# Patient Record
Sex: Male | Born: 1990 | Race: White | Hispanic: No | Marital: Married | Smoking: Never smoker
Health system: Southern US, Community
[De-identification: ages and names within clinical notes are randomized; demographics above are authoritative.]

## PROBLEM LIST (undated history)

## (undated) DIAGNOSIS — M549 Dorsalgia, unspecified: Secondary | ICD-10-CM

## (undated) HISTORY — PX: HAND SURGERY: SHX662

---

## 2009-04-05 ENCOUNTER — Emergency Department (HOSPITAL_COMMUNITY): Admission: EM | Admit: 2009-04-05 | Discharge: 2009-04-05 | Payer: Self-pay | Admitting: Emergency Medicine

## 2009-05-14 ENCOUNTER — Emergency Department (HOSPITAL_COMMUNITY): Admission: EM | Admit: 2009-05-14 | Discharge: 2009-05-14 | Payer: Self-pay | Admitting: Family Medicine

## 2013-11-11 ENCOUNTER — Emergency Department (HOSPITAL_COMMUNITY): Payer: Worker's Compensation

## 2013-11-11 ENCOUNTER — Encounter (HOSPITAL_COMMUNITY): Payer: Self-pay | Admitting: Emergency Medicine

## 2013-11-11 ENCOUNTER — Emergency Department (HOSPITAL_COMMUNITY)
Admission: EM | Admit: 2013-11-11 | Discharge: 2013-11-11 | Disposition: A | Discharge status: Home / Self Care | Payer: Worker's Compensation | Attending: Emergency Medicine | Admitting: Emergency Medicine

## 2013-11-11 DIAGNOSIS — Y929 Unspecified place or not applicable: Secondary | ICD-10-CM | POA: Insufficient documentation

## 2013-11-11 DIAGNOSIS — W230XXA Caught, crushed, jammed, or pinched between moving objects, initial encounter: Secondary | ICD-10-CM | POA: Diagnosis not present

## 2013-11-11 DIAGNOSIS — S8990XA Unspecified injury of unspecified lower leg, initial encounter: Secondary | ICD-10-CM | POA: Insufficient documentation

## 2013-11-11 DIAGNOSIS — Y9389 Activity, other specified: Secondary | ICD-10-CM | POA: Diagnosis not present

## 2013-11-11 DIAGNOSIS — S99929A Unspecified injury of unspecified foot, initial encounter: Principal | ICD-10-CM

## 2013-11-11 DIAGNOSIS — S99919A Unspecified injury of unspecified ankle, initial encounter: Secondary | ICD-10-CM | POA: Diagnosis present

## 2013-11-11 DIAGNOSIS — S8992XA Unspecified injury of left lower leg, initial encounter: Secondary | ICD-10-CM

## 2013-11-11 MED ORDER — OXYCODONE-ACETAMINOPHEN 5-325 MG PO TABS
2.0000 | ORAL_TABLET | Freq: Once | ORAL | Status: AC
  Administered 2013-11-11: 2 via ORAL
  Filled 2013-11-11: qty 2

## 2013-11-11 MED ORDER — OXYCODONE-ACETAMINOPHEN 5-325 MG PO TABS
1.0000 | ORAL_TABLET | ORAL | Status: DC | PRN
Start: 2013-11-11 — End: 2014-02-18

## 2013-11-11 MED ORDER — HYDROCODONE-ACETAMINOPHEN 5-325 MG PO TABS
2.0000 | ORAL_TABLET | Freq: Once | ORAL | Status: AC
  Administered 2013-11-11: 2 via ORAL
  Filled 2013-11-11: qty 2
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
## 2013-11-11 NOTE — ED Provider Notes (Signed)
Medical screening examination/treatment/procedure(s) were performed by non-physician practitioner and as supervising physician I was immediately available for consultation/collaboration.  Iliani Vejar T Zaylie Gisler, MD 11/11/13 2331 

## 2013-11-11 NOTE — Progress Notes (Signed)
Orthopedic Tech Progress Note Patient Details:  Yee Joss [facility] 1990/04/21 [id number] Applied Velcro knee immobilizer to LLE.  Pulses, sensation, motion intact before and after application.  Capillary refill less than 2 seconds before and after application.  Fit crutches and taught pt. use of same. Ortho Devices Type of Ortho Device: Knee Immobilizer Ortho Device/Splint Location: LLE Ortho Device/Splint Interventions: Application   Lesle Chris 11/11/2013, 7:23 PM

## 2013-11-11 NOTE — Discharge Instructions (Signed)
Take Percocet as needed for pain. Wear knee immobilizer as directed until orthopedic follow up. Use crutches as needed for support. Follow up with Dr. Valentina Gu for further evaluation.
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
## 2014-02-18 ENCOUNTER — Emergency Department (HOSPITAL_COMMUNITY)
Admission: EM | Admit: 2014-02-18 | Discharge: 2014-02-18 | Disposition: A | Discharge status: Home / Self Care | Payer: BC Managed Care – PPO | Attending: Emergency Medicine | Admitting: Emergency Medicine

## 2014-02-18 ENCOUNTER — Encounter (HOSPITAL_COMMUNITY): Payer: Self-pay | Admitting: Emergency Medicine

## 2014-02-18 DIAGNOSIS — M5442 Lumbago with sciatica, left side: Secondary | ICD-10-CM | POA: Insufficient documentation

## 2014-02-18 DIAGNOSIS — M545 Low back pain: Secondary | ICD-10-CM | POA: Diagnosis present

## 2014-02-18 DIAGNOSIS — R109 Unspecified abdominal pain: Secondary | ICD-10-CM | POA: Insufficient documentation

## 2014-02-18 HISTORY — DX: Dorsalgia, unspecified: M54.9

## 2014-02-18 MED ORDER — ONDANSETRON 4 MG PO TBDP
4.0000 mg | ORAL_TABLET | Freq: Once | ORAL | Status: AC
  Administered 2014-02-18: 4 mg via ORAL
  Filled 2014-02-18: qty 1

## 2014-02-18 MED ORDER — CYCLOBENZAPRINE HCL 10 MG PO TABS: 10.0000 mg | ORAL_TABLET | Freq: Two times a day (BID) | ORAL | Status: AC | PRN

## 2014-02-18 MED ORDER — IBUPROFEN 800 MG PO TABS
800.0000 mg | ORAL_TABLET | Freq: Once | ORAL | Status: AC
  Administered 2014-02-18: 800 mg via ORAL
  Filled 2014-02-18: qty 1

## 2014-02-18 MED ORDER — PREDNISONE 10 MG PO TABS: 20.0000 mg | ORAL_TABLET | Freq: Every day | ORAL | Status: AC

## 2014-02-18 MED ORDER — OXYCODONE-ACETAMINOPHEN 5-325 MG PO TABS: 1.0000 | ORAL_TABLET | Freq: Four times a day (QID) | ORAL | Status: AC | PRN

## 2014-02-18 MED ORDER — DIAZEPAM 5 MG PO TABS
5.0000 mg | ORAL_TABLET | Freq: Once | ORAL | Status: AC
  Administered 2014-02-18: 5 mg via ORAL
  Filled 2014-02-18: qty 1

## 2014-02-18 MED ORDER — OXYCODONE-ACETAMINOPHEN 5-325 MG PO TABS
1.0000 | ORAL_TABLET | Freq: Once | ORAL | Status: AC
  Administered 2014-02-18: 1 via ORAL
  Filled 2014-02-18: qty 1

## 2014-02-18 NOTE — Discharge Instructions (Signed)
Lumbosacral Strain °Lumbosacral strain is a strain of any of the parts that make up your lumbosacral vertebrae. Your lumbosacral vertebrae are the bones that make up the lower third of your backbone. Your lumbosacral vertebrae are held together by muscles and tough, fibrous tissue (ligaments).  °CAUSES  °A sudden blow to your back can cause lumbosacral strain. Also, anything that causes an excessive stretch of the muscles in the low back can cause this strain. This is typically seen when people exert themselves strenuously, fall, lift heavy objects, bend, or crouch repeatedly. °RISK FACTORS °· Physically demanding work. °· Participation in pushing or pulling sports or sports that require a sudden twist of the back (tennis, golf, baseball). °· Weight lifting. °· Excessive lower back curvature. °· Forward-tilted pelvis. °· Weak back or abdominal muscles or both. °· Tight hamstrings. °SIGNS AND SYMPTOMS  °Lumbosacral strain may cause pain in the area of your injury or pain that moves (radiates) down your leg.  °DIAGNOSIS °Your health care provider can often diagnose lumbosacral strain through a physical exam. In some cases, you may need tests such as X-ray exams.  °TREATMENT  °Treatment for your lower back injury depends on many factors that your clinician will have to evaluate. However, most treatment will include the use of anti-inflammatory medicines. °HOME CARE INSTRUCTIONS  °· Avoid hard physical activities (tennis, racquetball, waterskiing) if you are not in proper physical condition for it. This may aggravate or create problems. °· If you have a back problem, avoid sports requiring sudden body movements. Swimming and walking are generally safer activities. °· Maintain good posture. °· Maintain a healthy weight. °· For acute conditions, you may put ice on the injured area. °· Put ice in a plastic bag. °· Place a towel between your skin and the bag. °· Leave the ice on for 20 minutes, 2-3 times a day. °· When the  low back starts healing, stretching and strengthening exercises may be recommended. °SEEK MEDICAL CARE IF: °· Your back pain is getting worse. °· You experience severe back pain not relieved with medicines. °SEEK IMMEDIATE MEDICAL CARE IF:  °· You have numbness, tingling, weakness, or problems with the use of your arms or legs. °· There is a change in bowel or bladder control. °· You have increasing pain in any area of the body, including your belly (abdomen). °· You notice shortness of breath, dizziness, or feel faint. °· You feel sick to your stomach (nauseous), are throwing up (vomiting), or become sweaty. °· You notice discoloration of your toes or legs, or your feet get very cold. °MAKE SURE YOU:  °· Understand these instructions. °· Will watch your condition. °· Will get help right away if you are not doing well or get worse. °Document Released: 11/20/2004 Document Revised: 02/15/2013 Document Reviewed: 09/29/2012 °ExitCare® Patient Information ©2015 [employer]. This information is not intended to replace advice given to you by your health care provider. Make sure you discuss any questions you have with your health care provider. ° °Sciatica °Sciatica is pain, weakness, numbness, or tingling along the path of the sciatic nerve. The nerve starts in the lower back and runs down the back of each leg. The nerve controls the muscles in the lower leg and in the back of the knee, while also providing sensation to the back of the thigh, lower leg, and the sole of your foot. Sciatica is a symptom of another medical condition. For instance, nerve damage or certain conditions, such as a herniated disk or   bone spur on the spine, pinch or put pressure on the sciatic nerve. This causes the pain, weakness, or other sensations normally associated with sciatica. Generally, sciatica only affects one side of the body. °CAUSES  °· Herniated or slipped disc. °· Degenerative disk disease. °· A pain disorder involving the narrow  muscle in the buttocks (piriformis syndrome). °· Pelvic injury or fracture. °· Pregnancy. °· Tumor (rare). °SYMPTOMS  °Symptoms can vary from mild to very severe. The symptoms usually travel from the low back to the buttocks and down the back of the leg. Symptoms can include: °· Mild tingling or dull aches in the lower back, leg, or hip. °· Numbness in the back of the calf or sole of the foot. °· Burning sensations in the lower back, leg, or hip. °· Sharp pains in the lower back, leg, or hip. °· Leg weakness. °· Severe back pain inhibiting movement. °These symptoms may get worse with coughing, sneezing, laughing, or prolonged sitting or standing. Also, being overweight may worsen symptoms. °DIAGNOSIS  °Your caregiver will perform a physical exam to look for common symptoms of sciatica. He or she may ask you to do certain movements or activities that would trigger sciatic nerve pain. Other tests may be performed to find the cause of the sciatica. These may include: °· Blood tests. °· X-rays. °· Imaging tests, such as an MRI or CT scan. °TREATMENT  °Treatment is directed at the cause of the sciatic pain. Sometimes, treatment is not necessary and the pain and discomfort goes away on its own. If treatment is needed, your caregiver may suggest: °· Over-the-counter medicines to relieve pain. °· Prescription medicines, such as anti-inflammatory medicine, muscle relaxants, or narcotics. °· Applying heat or ice to the painful area. °· Steroid injections to lessen pain, irritation, and inflammation around the nerve. °· Reducing activity during periods of pain. °· Exercising and stretching to strengthen your abdomen and improve flexibility of your spine. Your caregiver may suggest losing weight if the extra weight makes the back pain worse. °· Physical therapy. °· Surgery to eliminate what is pressing or pinching the nerve, such as a bone spur or part of a herniated disk. °HOME CARE INSTRUCTIONS  °· Only take over-the-counter  or prescription medicines for pain or discomfort as directed by your caregiver. °· Apply ice to the affected area for 20 minutes, 3-4 times a day for the first 48-72 hours. Then try heat in the same way. °· Exercise, stretch, or perform your usual activities if these do not aggravate your pain. °· Attend physical therapy sessions as directed by your caregiver. °· Keep all follow-up appointments as directed by your caregiver. °· Do not wear high heels or shoes that do not provide proper support. °· Check your mattress to see if it is too soft. A firm mattress may lessen your pain and discomfort. °SEEK IMMEDIATE MEDICAL CARE IF:  °· You lose control of your bowel or bladder (incontinence). °· You have increasing weakness in the lower back, pelvis, buttocks, or legs. °· You have redness or swelling of your back. °· You have a burning sensation when you urinate. °· You have pain that gets worse when you lie down or awakens you at night. °· Your pain is worse than you have experienced in the past. °· Your pain is lasting longer than 4 weeks. °· You are suddenly losing weight without reason. °MAKE SURE YOU: °· Understand these instructions. °· Will watch your condition. °· Will get help right away if   you are not doing well or get worse. °Document Released: 02/04/2001 Document Revised: 08/12/2011 Document Reviewed: 06/22/2011 °ExitCare® Patient Information ©2015 [employer]. This information is not intended to replace advice given to you by your health care provider. Make sure you discuss any questions you have with your health care provider. ° °

## 2014-02-18 NOTE — ED Notes (Deleted)
Pt tripped and fell striking l/upper face on sidewalk 8 days ago.. 6 sutures reported. Suture line well approximated

## 2014-02-18 NOTE — ED Provider Notes (Signed)
CSN: [id number]     Arrival date & time 02/18/14  1034 History  This chart was scribed for non-physician practitioner Carlos Peliffany Starasia Sinko, PA-C working with Lyanne CoKevin M Campos, MD by Conchita ParisNadim Abuhashem, ED Scribe. This patient was seen in WTR8/WTR8 and the patient's care was started at 12:18 PM.   Chief Complaint  Patient presents with  . Back Pain    low back pain radiating down l./leg  . Leg Pain   Patient is a 23 y.o. male presenting with back pain and leg pain. The history is provided by the patient. No language interpreter was used.  Back Pain Associated symptoms: abdominal pain, leg pain and numbness   Leg Pain Associated symptoms: back pain     HPI Comments: Carlos Becker is a 23 y.o. male who presents to the Emergency Department complaining of lower back pain, onset 2 days ago. The pain has gradually worsened since onset and is primarily localized on the left side of his back. He has been having re-occuring back pain for the last two years. He has numbness, tingling and shooting pain into the leg and gait problem as associated symptoms. He says he drags is left leg. Pt has tried ibuprofen for no relief. Movement worsens the pain. He denies trauma. Pt also denies urine/bowel incontinence and trouble with BM. He does have a Hx of back pain, notes having a pinched nerve.  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 Back pain    Past Surgical History  Procedure Laterality Date  . Hand surgery      r/thumb   Family History  Problem Relation Age of Onset  . Diabetes Other   . Hypertension Other   . Cancer Other    History  Substance Use Topics  . Smoking status: Never Smoker   . Smokeless tobacco: Not on file  . Alcohol Use: Yes    Review of Systems  Gastrointestinal: Positive for abdominal pain.  Musculoskeletal: Positive for back pain and gait problem.  Neurological: Positive for numbness.  All other systems  reviewed and are negative.  Allergies  Review of patient's allergies indicates no known allergies.  Home Medications   Prior to Admission medications   Medication Sig Start Date End Date Taking? Authorizing Provider  acetaminophen (TYLENOL) 500 MG tablet Take 500 mg by mouth every 6 (six) hours as needed for moderate pain.   Yes Historical Provider, MD  ibuprofen (ADVIL,MOTRIN) 200 MG tablet Take 800 mg by mouth every 6 (six) hours as needed for moderate pain.   Yes Historical Provider, MD  cyclobenzaprine (FLEXERIL) 10 MG tablet Take 1 tablet (10 mg total) by mouth 2 (two) times daily as needed for muscle spasms. 02/18/14   Florenda Watt Irine SealG Makalya Nave, PA-C  oxyCODONE-acetaminophen (PERCOCET/ROXICET) 5-325 MG per tablet Take 1-2 tablets by mouth every 6 (six) hours as needed for severe pain. 02/18/14   Kenlee Vogt Irine SealG Ruari Duggan, PA-C  predniSONE (DELTASONE) 10 MG tablet Take 2 tablets (20 mg total) by mouth daily. 02/18/14   Matej Sappenfield Irine SealG Vivianne Carles, PA-C   BP 139/79 mmHg  Pulse 73  Temp(Src) 97.6 F (36.4 C) (Oral)  Resp 16  SpO2 99% Physical Exam  Physical Exam  Constitutional: He appears well-developed and well-nourished. No distress.  HENT:  Head: Normocephalic and atraumatic.  Eyes: Pupils are equal, round, and reactive to light.  Neck: Normal range of motion. Neck supple.  Cardiovascular: Normal rate and  regular rhythm.   Pulmonary/Chest: Effort normal.  Abdominal: Soft.  Musculoskeletal:       Back:  On initial exam: pt unable to move left lower leg due to pain.  On repeat exam: pt has equal strength to bilateral lower extremities.  Neurosensory function adequate to both legs No clonus on dorsiflextion Skin color is normal. Skin is warm and moist.  I see no step off deformity, no midline bony tenderness.  Pt is able to ambulate.  No crepitus, laceration, effusion, induration, lesions, swelling.   Pedal pulses are symmetrical and palpable bilaterally  Very mild tenderness to palpation of  paraspinel muscles to the left low back.  Neurological: He is alert.  Skin: Skin is warm and dry.  Nursing note and vitals reviewed.   ED Course  Procedures  DIAGNOSTIC STUDIES: Oxygen Saturation is 99% on room air, normal by my interpretation.    COORDINATION OF CARE: 12:19 PM Discussed treatment plan with pt at bedside and pt agreed to plan.  Labs Review Labs Reviewed - No data to display  Imaging Review No results found.   EKG Interpretation None      MDM   Final diagnoses:  Left-sided low back pain with left-sided sciatica   Medications  ondansetron (ZOFRAN-ODT) disintegrating tablet 4 mg (4 mg Oral Given 02/18/14 1245)  oxyCODONE-acetaminophen (PERCOCET/ROXICET) 5-325 MG per tablet 1 tablet (1 tablet Oral Given 02/18/14 1245)  diazepam (VALIUM) tablet 5 mg (5 mg Oral Given 02/18/14 1245)  ibuprofen (ADVIL,MOTRIN) tablet 800 mg (800 mg Oral Given 02/18/14 1245)    23 y.o.Carlos Becker's  with back pain. No neurological deficits and normal neuro exam. Patient can walk. No loss of bowel or bladder control. No concern for cauda equina at this time base on HPI and physical exam findings. No fever, night sweats, weight loss, h/o cancer, IVDU.  Referred to Ortho, pt could probably use an MRI.   RICE protocol and pain medicine indicated and discussed with patient.   Patient Plan 1. Medications: pain medication and muscle relaxer. Cont usual home medications unless otherwise directed. 2. Treatment: rest, drink plenty of fluids, gentle stretching as discussed, alternate ice and heat  3. Follow Up: Please followup with your primary doctor for discussion of your diagnoses and further evaluation after today's visit; if you do not have a primary care doctor use the resource guide provided to find one  Advised to follow-up with the orthopedist if symptoms do not start to resolve in the next 2-3 days. If develop loss of bowel or urinary control return to the ED as soon as  possible for further evaluation. To take the medications as prescribed as they can cause harm if not taken appropriately.   Vital signs are stable at discharge. Filed Vitals:   02/18/14 1048  BP: 139/79  Pulse: 73  Temp: 97.6 F (36.4 C)  Resp: 16    Patient/guardian has voiced understanding and agreed to follow-up with the PCP or specialist.   I personally performed the services described in this documentation, which was scribed in my presence. The recorded information has been reviewed and is accurate.     Dorthula Matasiffany G Rafiq Bucklin, PA-C 02/18/14 1341  Lyanne CoKevin M Campos, MD 02/18/14 [phone]

## 2014-02-18 NOTE — ED Notes (Signed)
Pt reports low back pain radiating to l/leg, knee and calf x 2 days.

## 2016-04-02 IMAGING — CT CT KNEE*L* W/O CM
2 of 3 series · 7 of 14 positions shown, 9 images · non-contrast
Comparison: Plain films earlier today.

CLINICAL DATA: Leg injury, leg pain.

EXAM:
CT OF THE LEFT KNEE WITHOUT CONTRAST
TECHNIQUE: Multidetector CT imaging of the left knee was performed according to
the standard protocol. Multiplanar CT image reconstructions were
also generated.

[Series 3: bone windows · axial · 0.36mm/px · z∈[+548,+616]mm · 2 of 69 slices shown]
[im 23/69  bone]
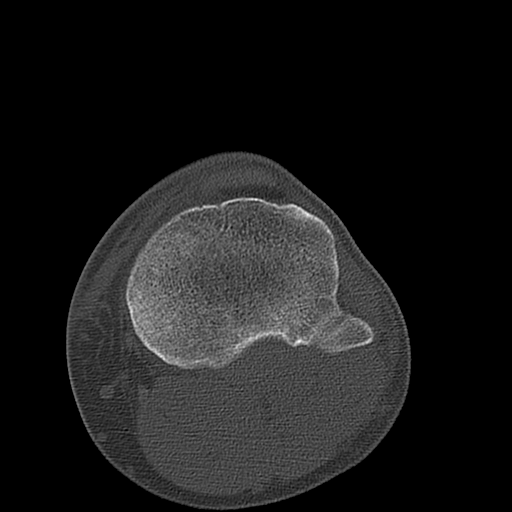
[im 46/69  bone]
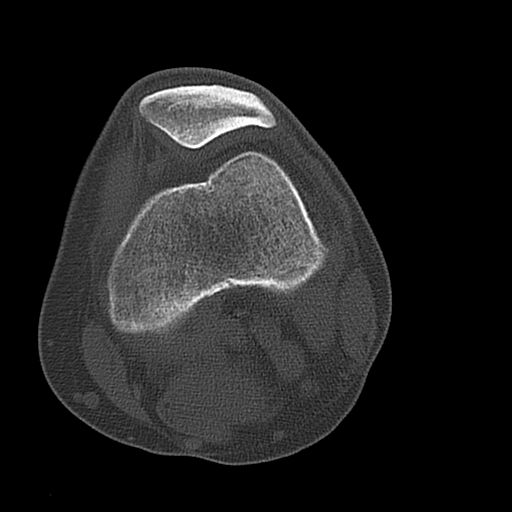

[Series 7: axial bone · axial · 0.31mm/px · z∈[+516,+652]mm · 5 of 102 slices shown, 7 images]
[im 17/102  soft-tissue]
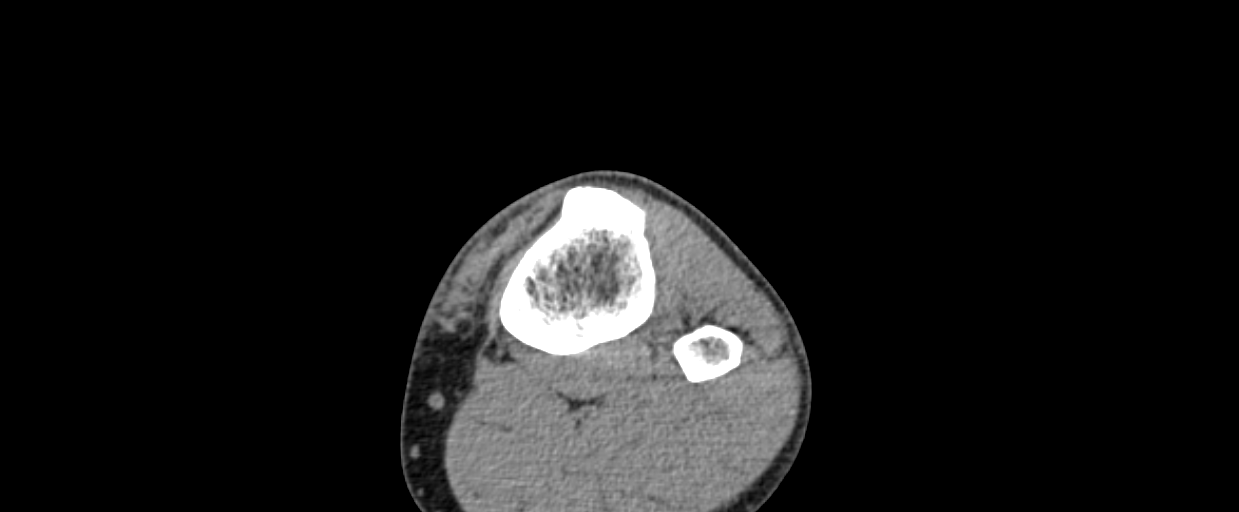
[im 17/102  bone]
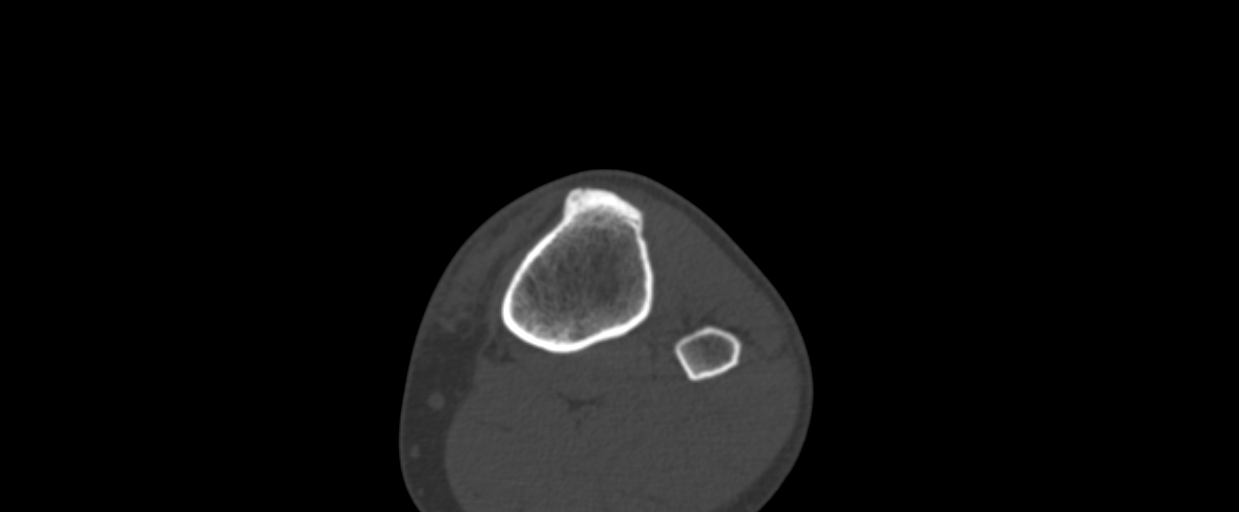
[im 34/102  bone]
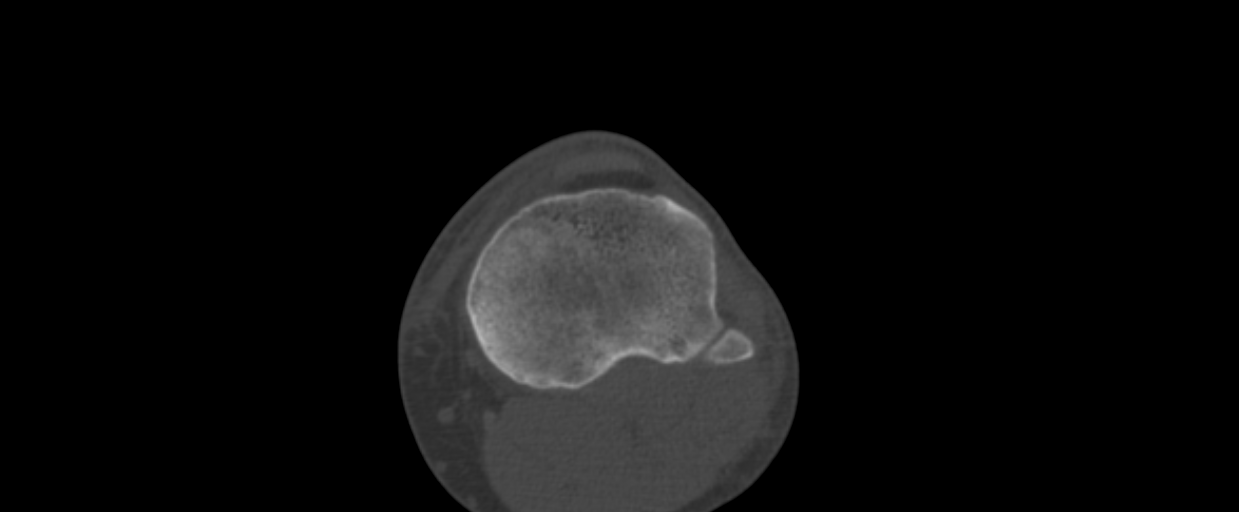
[im 51/102  bone]
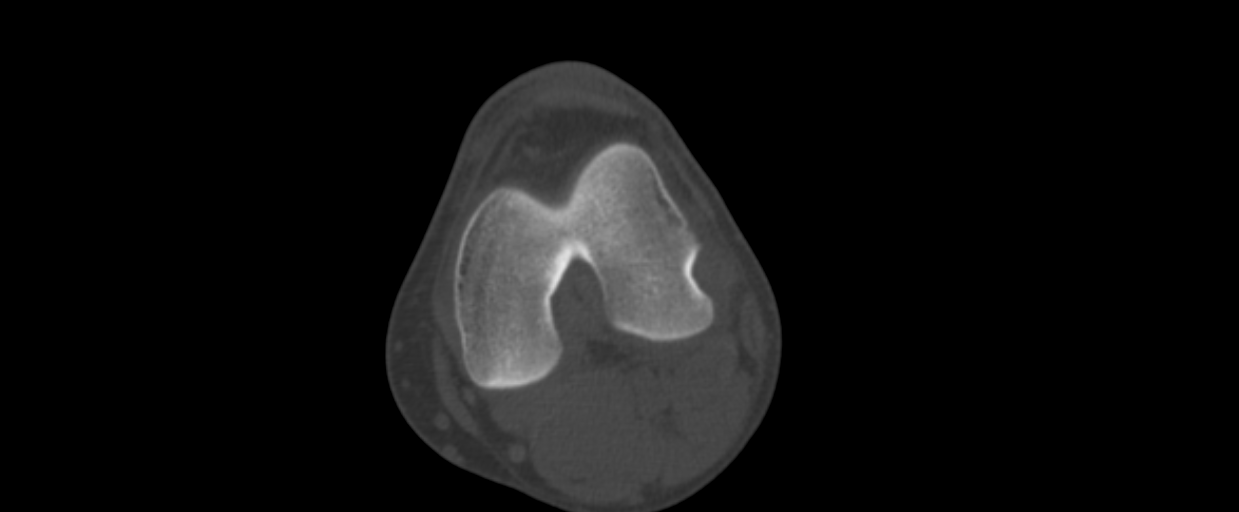
[im 68/102  bone]
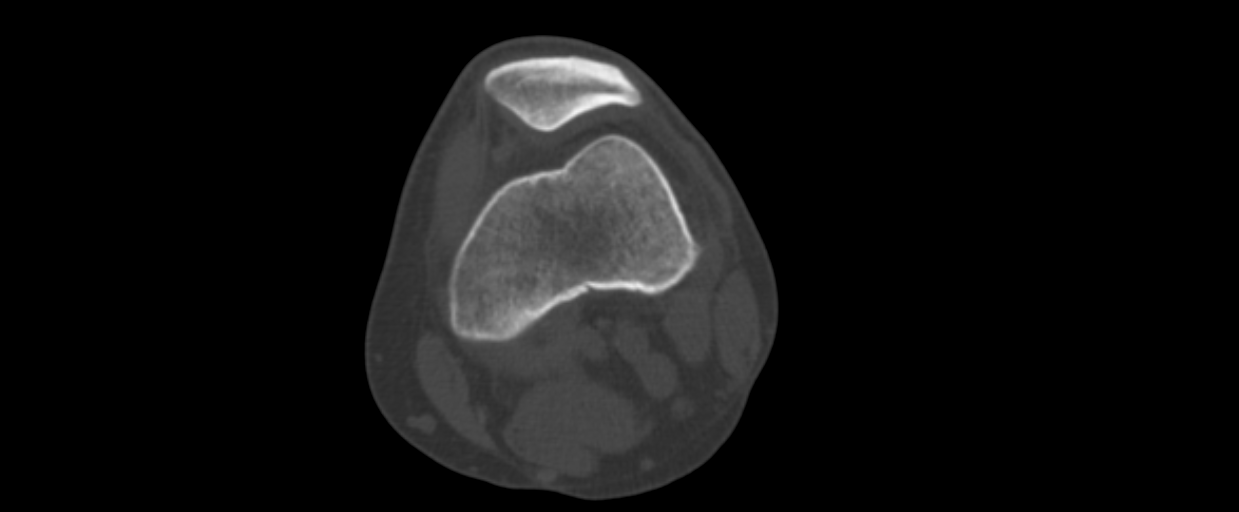
[im 85/102  soft-tissue]
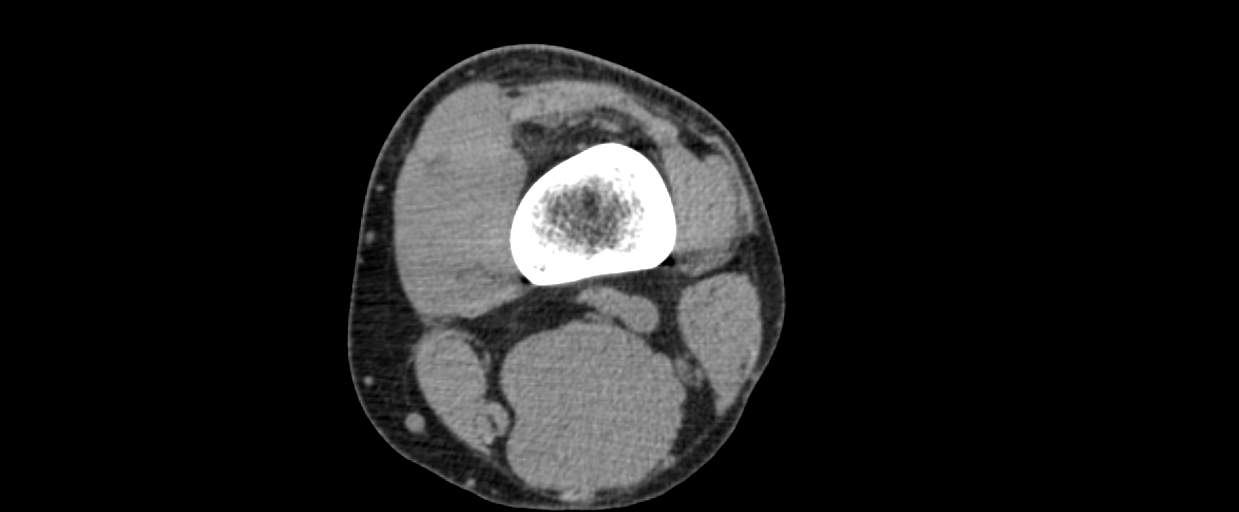
[im 85/102  bone]
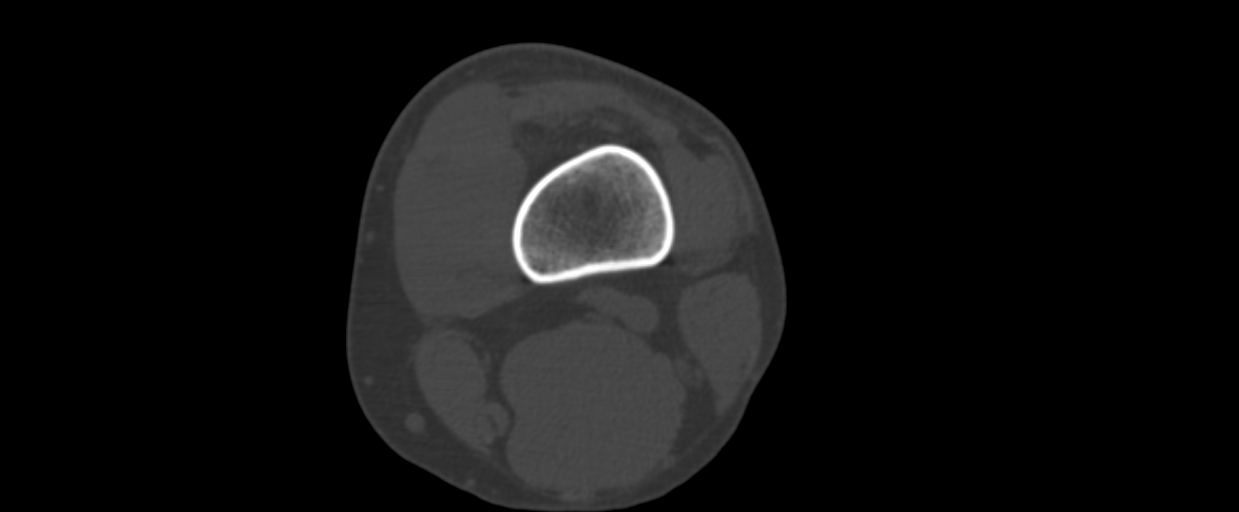

[7 of 14 positions shown; findings below may reference images not displayed]

FINDINGS: No acute bony abnormality. No acute fracture, subluxation or
dislocation. No joint effusion. Soft tissues are unremarkable.
IMPRESSION: No acute bony abnormality.
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
